# Patient Record
Sex: Male | Born: 1995 | Race: White | Hispanic: No | Marital: Single | Smoking: Never smoker
Health system: Southern US, Community
[De-identification: ages and names within clinical notes are randomized; demographics above are authoritative.]

## PROBLEM LIST (undated history)

## (undated) DIAGNOSIS — M1990 Unspecified osteoarthritis, unspecified site: Secondary | ICD-10-CM

---
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
## 2018-03-26 ENCOUNTER — Other Ambulatory Visit: Payer: Self-pay

## 2018-03-26 ENCOUNTER — Emergency Department
Admission: EM | Admit: 2018-03-26 | Discharge: 2018-03-26 | Disposition: A | Discharge status: Home / Self Care | Payer: Self-pay | Attending: Student in an Organized Health Care Education/Training Program | Admitting: Student in an Organized Health Care Education/Training Program

## 2018-03-26 ENCOUNTER — Encounter: Payer: Self-pay | Admitting: Emergency Medicine

## 2018-03-26 DIAGNOSIS — J0100 Acute maxillary sinusitis, unspecified: Secondary | ICD-10-CM | POA: Insufficient documentation

## 2018-03-26 DIAGNOSIS — J45909 Unspecified asthma, uncomplicated: Secondary | ICD-10-CM | POA: Insufficient documentation

## 2018-03-26 DIAGNOSIS — Z79899 Other long term (current) drug therapy: Secondary | ICD-10-CM | POA: Insufficient documentation

## 2018-03-26 DIAGNOSIS — F17220 Nicotine dependence, chewing tobacco, uncomplicated: Secondary | ICD-10-CM | POA: Insufficient documentation

## 2018-03-26 MED ORDER — FLUTICASONE PROPIONATE 50 MCG/ACT NA SUSP: 2.0000 | Freq: Every day | NASAL | 0 refills | Status: AC

## 2018-03-26 MED ORDER — IPRATROPIUM-ALBUTEROL 0.5-2.5 (3) MG/3ML IN SOLN
3.0000 mL | Freq: Once | RESPIRATORY_TRACT | Status: AC
Start: 2018-03-26 — End: 2018-03-26
  Administered 2018-03-26: 3 mL via RESPIRATORY_TRACT
  Filled 2018-03-26: qty 3

## 2018-03-26 MED ORDER — ALBUTEROL SULFATE HFA 108 (90 BASE) MCG/ACT IN AERS: 2.0000 | INHALATION_SPRAY | Freq: Four times a day (QID) | RESPIRATORY_TRACT | 0 refills | Status: AC | PRN

## 2018-03-26 MED ORDER — DOXYCYCLINE HYCLATE 50 MG PO CAPS: 100.0000 mg | ORAL_CAPSULE | Freq: Two times a day (BID) | ORAL | 0 refills | Status: AC

## 2018-03-26 NOTE — ED Triage Notes (Signed)
C/O cough and sinus congestion x 2 weeks.  Also facial pain intermittently.

## 2018-03-26 NOTE — ED Notes (Signed)
First Nurse Note: Patient speaking in full sentences, voice sounds congested.  NAD.

## 2018-03-26 NOTE — ED Provider Notes (Signed)
[facility] Emergency Department Provider Note  ____________________________________________  Time seen: Approximately 9:13 AM  I have reviewed the triage vital signs and the nursing notes.   HISTORY  Chief Complaint Cough and sinus congestion    HPI Christopher Mullen is a 23 y.o. male presents emergency department for evaluation of nasal congestion for 2 weeks and nonproductive cough for 2 days.  Patient states that he can feel congestion running down the back of his throat.  This morning he was walking to work and states that the congestion made it difficult to breathe.  This resolved.  He had asthma as a child but has not had difficulty as an adult.  He has not had any fevers.  His mom has similar symptoms.  He does not smoke but he dips.  No allergies.  History reviewed. No pertinent past medical history.  There are no active problems to display for this patient.   History reviewed. No pertinent surgical history.  Prior to Admission medications   Medication Sig Start Date End Date Taking? Authorizing Provider  albuterol (PROVENTIL HFA;VENTOLIN HFA) 108 (90 Base) MCG/ACT inhaler Inhale 2 puffs into the lungs every 6 (six) hours as needed for wheezing or shortness of breath. 03/26/18   Enid DerryWagner, Margueritte Guthridge, PA-C  cetirizine (ZYRTEC) 10 MG tablet Take 1 tablet (10 mg total) by mouth daily. 05/26/13   Presson, Mathis FareJennifer Lee H, PA  chlorpheniramine-HYDROcodone (TUSSIONEX PENNKINETIC ER) 10-8 MG/5ML LQCR Take 5 mLs by mouth every 12 (twelve) hours as needed for cough. 04/13/14   Linna HoffKindl, James D, MD  dextromethorphan (DELSYM) 30 MG/5ML liquid Take 5 mLs (30 mg total) by mouth 2 (two) times daily as needed for cough. 05/26/13   Presson, Mathis FareJennifer Lee H, PA  doxycycline (VIBRAMYCIN) 50 MG capsule Take 2 capsules (100 mg total) by mouth 2 (two) times daily for 10 days. 03/26/18 04/05/18  Enid DerryWagner, Laquia Rosano, PA-C  fluticasone (FLONASE) 50 MCG/ACT nasal spray Place 2 sprays into both nostrils  daily. 03/26/18 03/26/19  Enid DerryWagner, Omer Monter, PA-C  ipratropium (ATROVENT) 0.06 % nasal spray Place 2 sprays into both nostrils 4 (four) times daily. 02/12/15   Linna HoffKindl, James D, MD  loperamide (IMODIUM) 2 MG capsule Take 1 capsule (2 mg total) by mouth 4 (four) times daily as needed for diarrhea or loose stools. 03/25/13   Kirichenko, Tatyana, PA-C  ondansetron (ZOFRAN ODT) 8 MG disintegrating tablet Take 1 tablet (8 mg total) by mouth every 8 (eight) hours as needed for nausea or vomiting. 03/25/13   Kirichenko, Lemont Fillersatyana, PA-C  predniSONE (DELTASONE) 50 MG tablet 1 tab daily for 2 days then 1/2 tab daily for 2 days. 02/12/15   Linna HoffKindl, James D, MD    Allergies Patient has no known allergies.  No family history on file.  Social History Social History   Tobacco Use  . Smoking status: Never Smoker  . Smokeless tobacco: Never Used  Substance Use Topics  . Alcohol use: No  . Drug use: Not on file     Review of Systems  Constitutional: No fever/chills Eyes: No visual changes. No discharge. ENT: Positive for congestion and rhinorrhea. Cardiovascular: No chest pain. Respiratory: Positive for cough. No SOB. Gastrointestinal: No abdominal pain.  No nausea, no vomiting.  No diarrhea.  No constipation. Musculoskeletal: Negative for musculoskeletal pain. Skin: Negative for rash, abrasions, lacerations, ecchymosis. Neurological: Negative for headaches.   ____________________________________________   PHYSICAL EXAM:  VITAL SIGNS: ED Triage Vitals  Enc Vitals Group     BP 03/26/18 0814  137/74     Pulse Rate 03/26/18 0814 89     Resp 03/26/18 0814 18     Temp 03/26/18 0814 98.1 F (36.7 C)     Temp Source 03/26/18 0814 Oral     SpO2 03/26/18 0814 96 %     Weight 03/26/18 0811 (!) 320 lb (145.2 kg)     Height 03/26/18 0811 6\' 3"  (1.905 m)     Head Circumference --      Peak Flow --      Pain Score 03/26/18 0811 0     Pain Loc --      Pain Edu? --      Excl. in GC? --      Constitutional:  Alert and oriented. Well appearing and in no acute distress. Eyes: Conjunctivae are normal. PERRL. EOMI. No discharge. Head: Atraumatic. ENT: No frontal and maxillary sinus tenderness.      Ears: Tympanic membranes pearly gray with good landmarks. No discharge.      Nose: Mild congestion/rhinnorhea.      Mouth/Throat: Mucous membranes are moist. Oropharynx non-erythematous. Tonsils not enlarged. No exudates. Uvula midline. Neck: No stridor.   Hematological/Lymphatic/Immunilogical: No cervical lymphadenopathy. Cardiovascular: Normal rate, regular rhythm.  Good peripheral circulation. Respiratory: Normal respiratory effort without tachypnea or retractions. Lungs CTAB. Good air entry to the bases with no decreased or absent breath sounds. Gastrointestinal: Bowel sounds 4 quadrants. Soft and nontender to palpation. No guarding or rigidity. No palpable masses. No distention. Musculoskeletal: Full range of motion to all extremities. No gross deformities appreciated. Neurologic:  Normal speech and language. No gross focal neurologic deficits are appreciated.  Skin:  Skin is warm, dry and intact. No rash noted. Psychiatric: Mood and affect are normal. Speech and behavior are normal. Patient exhibits appropriate insight and judgement.   ____________________________________________   LABS (all labs ordered are listed, but only abnormal results are displayed)  Labs Reviewed - No data to display ____________________________________________  EKG   ____________________________________________  RADIOLOGY  No results found.  ____________________________________________    PROCEDURES  Procedure(s) performed:    Procedures    Medications  ipratropium-albuterol (DUONEB) 0.5-2.5 (3) MG/3ML nebulizer solution 3 mL (3 mLs Nebulization Given 03/26/18 1027)     ____________________________________________   INITIAL IMPRESSION / ASSESSMENT AND PLAN / ED COURSE  Pertinent labs &  imaging results that were available during my care of the patient were reviewed by me and considered in my medical decision making (see chart for details).  Review of the [place] CSRS was performed in accordance of the [facility] prior to dispensing any controlled drugs.   Patient's diagnosis is consistent with sinusitis. Vital signs and exam are reassuring.  Patient declines chest x-ray.  Patient appears well and is staying well hydrated. Patient should alternate tylenol and ibuprofen for fever. Patient feels comfortable going home. Patient will be discharged home with prescriptions for doxycycline, albuterol, Flonase. Patient is to follow up with primary care as needed or otherwise directed. Patient is given ED precautions to return to the ED for any worsening or new symptoms.     ____________________________________________  FINAL CLINICAL IMPRESSION(S) / ED DIAGNOSES  Final diagnoses:  Acute non-recurrent maxillary sinusitis      NEW MEDICATIONS STARTED DURING THIS VISIT:  ED Discharge Orders         Ordered    doxycycline (VIBRAMYCIN) 50 MG capsule  2 times daily     03/26/18 0934    fluticasone (FLONASE) 50 MCG/ACT nasal spray  Daily  03/26/18 0934    albuterol (PROVENTIL HFA;VENTOLIN HFA) 108 (90 Base) MCG/ACT inhaler  Every 6 hours PRN     03/26/18 0934              This chart was dictated using voice recognition software/Dragon. Despite best efforts to proofread, errors can occur which can change the meaning. Any change was purely unintentional.    Enid Derry, PA-C 03/26/18 1554    Willy Eddy, MD 03/27/18 [phone]

## 2018-03-26 NOTE — ED Notes (Signed)
See triage note  States he developed cough and some diff breathing  Sx's started about 2 weeks ago  Min relief with OTC meds but states sx's return when meds wear off

## 2020-03-05 ENCOUNTER — Other Ambulatory Visit: Payer: Medicaid Other

## 2020-05-09 ENCOUNTER — Encounter (HOSPITAL_COMMUNITY): Payer: Self-pay | Admitting: *Deleted

## 2020-05-09 ENCOUNTER — Other Ambulatory Visit: Payer: Self-pay

## 2020-05-09 ENCOUNTER — Ambulatory Visit (HOSPITAL_COMMUNITY)
Admission: EM | Admit: 2020-05-09 | Discharge: 2020-05-09 | Disposition: A | Discharge status: Home / Self Care | Payer: 59 | Attending: Emergency Medicine | Admitting: Emergency Medicine

## 2020-05-09 DIAGNOSIS — J029 Acute pharyngitis, unspecified: Secondary | ICD-10-CM | POA: Insufficient documentation

## 2020-05-09 DIAGNOSIS — K219 Gastro-esophageal reflux disease without esophagitis: Secondary | ICD-10-CM | POA: Insufficient documentation

## 2020-05-09 DIAGNOSIS — J039 Acute tonsillitis, unspecified: Secondary | ICD-10-CM | POA: Diagnosis present

## 2020-05-09 LAB — POCT RAPID STREP A, ED / UC: Streptococcus, Group A Screen (Direct): NEGATIVE

## 2020-05-09 MED ORDER — OMEPRAZOLE 20 MG PO CPDR: 20.0000 mg | DELAYED_RELEASE_CAPSULE | Freq: Every day | ORAL | 0 refills | Status: AC

## 2020-05-09 MED ORDER — AMOXICILLIN 500 MG PO CAPS: 500.0000 mg | ORAL_CAPSULE | Freq: Three times a day (TID) | ORAL | 0 refills | Status: AC

## 2020-05-09 NOTE — ED Triage Notes (Signed)
PT reports he has a sore throat and saw white sports on his throat. Pt also reports he has acid reflux and needs meds.

## 2020-05-09 NOTE — ED Provider Notes (Signed)
[facility]    CSN: [id number] Arrival date & time: 05/09/20  1501      History   Chief Complaint Chief Complaint  Patient presents with  . Sore Throat    HPI Christopher Mullen is a 25 y.o. male.   Christopher Mullen is a 25 year old male with complaint of sore throat for the past few days.  Reports noticing white spots on his throat this a.m.  Reports history of acid reflux and initially believed sore throat was related to that.  Reports symptoms worse with swallowing.  Has not tried any treatments at home.  Denies any ear pain, nasal congestion, fevers, chest pain, shortness of breath, N/V/D, abdominal pain, or headaches.    ROS: As per HPI, all other pertinent ROS negative   The history is provided by the patient.    History reviewed. No pertinent past medical history.  There are no problems to display for this patient.   History reviewed. No pertinent surgical history.     Home Medications    Prior to Admission medications   Medication Sig Start Date End Date Taking? Authorizing Provider  amoxicillin (AMOXIL) 500 MG capsule Take 1 capsule (500 mg total) by mouth 3 (three) times daily. 05/09/20  Yes Ivette Loyal, NP  omeprazole (PRILOSEC) 20 MG capsule Take 1 capsule (20 mg total) by mouth daily. 05/09/20  Yes Ivette Loyal, NP  albuterol (PROVENTIL HFA;VENTOLIN HFA) 108 (90 Base) MCG/ACT inhaler Inhale 2 puffs into the lungs every 6 (six) hours as needed for wheezing or shortness of breath. 03/26/18   Enid Derry, PA-C  cetirizine (ZYRTEC) 10 MG tablet Take 1 tablet (10 mg total) by mouth daily. 05/26/13   Presson, Mathis Fare, PA  chlorpheniramine-HYDROcodone (TUSSIONEX PENNKINETIC ER) 10-8 MG/5ML LQCR Take 5 mLs by mouth every 12 (twelve) hours as needed for cough. 04/13/14   Linna Hoff, MD  dextromethorphan (DELSYM) 30 MG/5ML liquid Take 5 mLs (30 mg total) by mouth 2 (two) times daily as needed for cough. 05/26/13   Presson, Mathis Fare, PA  fluticasone  (FLONASE) 50 MCG/ACT nasal spray Place 2 sprays into both nostrils daily. 03/26/18 03/26/19  Enid Derry, PA-C  ipratropium (ATROVENT) 0.06 % nasal spray Place 2 sprays into both nostrils 4 (four) times daily. 02/12/15   Linna Hoff, MD  loperamide (IMODIUM) 2 MG capsule Take 1 capsule (2 mg total) by mouth 4 (four) times daily as needed for diarrhea or loose stools. 03/25/13   Kirichenko, Tatyana, PA-C  ondansetron (ZOFRAN ODT) 8 MG disintegrating tablet Take 1 tablet (8 mg total) by mouth every 8 (eight) hours as needed for nausea or vomiting. 03/25/13   Kirichenko, Lemont Fillers, PA-C  predniSONE (DELTASONE) 50 MG tablet 1 tab daily for 2 days then 1/2 tab daily for 2 days. 02/12/15   Linna Hoff, MD    Family History History reviewed. No pertinent family history.  Social History Social History   Tobacco Use  . Smoking status: Never Smoker  . Smokeless tobacco: Never Used  Substance Use Topics  . Alcohol use: No     Allergies   Patient has no known allergies.   Review of Systems Review of Systems   Physical Exam Triage Vital Signs ED Triage Vitals  Enc Vitals Group     BP 05/09/20 1546 (!) 142/46     Pulse Rate 05/09/20 1546 90     Resp 05/09/20 1546 18     Temp 05/09/20 1546 98.1 F (  36.7 C)     Temp Source 05/09/20 1546 Oral     SpO2 05/09/20 1546 95 %     Weight --      Height --      Head Circumference --      Peak Flow --      Pain Score 05/09/20 1550 7     Pain Loc --      Pain Edu? --      Excl. in GC? --    No data found.  Updated Vital Signs BP (!) 142/46 (BP Location: Left Arm)   Pulse 90   Temp 98.1 F (36.7 C) (Oral)   Resp 18   SpO2 95%   Visual Acuity Right Eye Distance:   Left Eye Distance:   Bilateral Distance:    Right Eye Near:   Left Eye Near:    Bilateral Near:     Physical Exam Vitals and nursing note reviewed.  Constitutional:      General: He is not in acute distress.    Appearance: Normal appearance. He is not  ill-appearing, toxic-appearing or diaphoretic.  HENT:     Head: Normocephalic and atraumatic.     Right Ear: Tympanic membrane and ear canal normal.     Left Ear: Tympanic membrane and ear canal normal.     Mouth/Throat:     Mouth: Mucous membranes are moist.     Pharynx: Posterior oropharyngeal erythema present.     Tonsils: Tonsillar exudate present. 2+ on the right. 2+ on the left.  Eyes:     Conjunctiva/sclera: Conjunctivae normal.  Cardiovascular:     Rate and Rhythm: Normal rate.     Pulses: Normal pulses.  Pulmonary:     Effort: Pulmonary effort is normal.  Abdominal:     General: Abdomen is flat.     Palpations: Abdomen is soft.  Musculoskeletal:        General: Normal range of motion.     Cervical back: Normal range of motion.  Skin:    General: Skin is warm and dry.  Neurological:     General: No focal deficit present.     Mental Status: He is alert and oriented to person, place, and time.  Psychiatric:        Mood and Affect: Mood normal.      UC Treatments / Results  Labs (all labs ordered are listed, but only abnormal results are displayed) Labs Reviewed  POCT RAPID STREP A, ED / UC    EKG   Radiology No results found.  Procedures Procedures (including critical care time)  Medications Ordered in UC Medications - No data to display  Initial Impression / Assessment and Plan / UC Course  I have reviewed the triage vital signs and the nursing notes.  Pertinent labs & imaging results that were available during my care of the patient were reviewed by me and considered in my medical decision making (see chart for details).     Tonsillitis  Sore Throat 1. Rapid strep negative. Throat culture pending.  2. Based on modified [facility] Criteria, will treat with amoxicillin 500mg  BID x10 days 3. Tylenol/Ibuprofen for fever reduction and pain relief.  4. Warm beverages, gargle warm salt water, lozenges, sprays, and popsicles for throat pain relief.    GERD 1. Omeprazole 20mg  qd 2. Try to avoid laying down after eating.  Avoid spicy or high acidity foods.  Drink lots of water.  3. Get established with PCP  Final Clinical Impressions(s) /  UC Diagnoses   Final diagnoses:  Gastroesophageal reflux disease without esophagitis  Tonsillitis  Sore throat     Discharge Instructions     Take Omeprazole daily.   Avoid spicy or high acidity foods.  Don't lay down immediately after eating.   Drink lots of water.  Follow up with Primary Care Provider.   Take the Amoxil twice a day for 10 days.   You may also use Ibuprofen/Tylenol as needed for fever reduction and pain relief.  Warm beverages, including hot teas and hot water with lemon, can help to relieve throat pain.  You can also gargle salt water and use throat sprays/lozenges.    Return or go to the Emergency Department if symptoms worsen or do not improve in the next few days.       ED Prescriptions    Medication Sig Dispense Auth. Provider   omeprazole (PRILOSEC) 20 MG capsule Take 1 capsule (20 mg total) by mouth daily. 30 capsule Ivette Loyal, NP   amoxicillin (AMOXIL) 500 MG capsule Take 1 capsule (500 mg total) by mouth 3 (three) times daily. 21 capsule Ivette Loyal, NP     PDMP not reviewed this encounter.   Ivette Loyal, NP 05/09/20 [phone]

## 2020-05-09 NOTE — Discharge Instructions (Addendum)
Take Omeprazole daily.   Avoid spicy or high acidity foods.  Don't lay down immediately after eating.   Drink lots of water.  Follow up with Primary Care Provider.   Take the Amoxil twice a day for 10 days.   You may also use Ibuprofen/Tylenol as needed for fever reduction and pain relief.  Warm beverages, including hot teas and hot water with lemon, can help to relieve throat pain.  You can also gargle salt water and use throat sprays/lozenges.    Return or go to the Emergency Department if symptoms worsen or do not improve in the next few days.

## 2020-05-11 LAB — CULTURE, GROUP A STREP (THRC)

## 2020-05-12 LAB — CULTURE, GROUP A STREP (THRC)
